# Patient Record
Sex: Female | Born: 1960 | Race: Black or African American | Hispanic: No | Marital: Single | Smoking: Never smoker
Health system: Southern US, Community
[De-identification: ages and names within clinical notes are randomized; demographics above are authoritative.]

## PROBLEM LIST (undated history)

## (undated) DIAGNOSIS — N289 Disorder of kidney and ureter, unspecified: Secondary | ICD-10-CM

## (undated) DIAGNOSIS — E669 Obesity, unspecified: Secondary | ICD-10-CM

## (undated) DIAGNOSIS — M549 Dorsalgia, unspecified: Secondary | ICD-10-CM

## (undated) DIAGNOSIS — J329 Chronic sinusitis, unspecified: Secondary | ICD-10-CM

## (undated) DIAGNOSIS — F419 Anxiety disorder, unspecified: Secondary | ICD-10-CM

## (undated) HISTORY — PX: TUBAL LIGATION: SHX77

## (undated) HISTORY — PX: BREAST SURGERY: SHX581

## (undated) HISTORY — PX: ABDOMINAL SURGERY: SHX537

---

## 2016-05-14 ENCOUNTER — Encounter: Payer: Self-pay | Admitting: Gynecology

## 2016-05-14 ENCOUNTER — Ambulatory Visit
Admission: EM | Admit: 2016-05-14 | Discharge: 2016-05-14 | Disposition: A | Discharge status: Home / Self Care | Payer: Managed Care, Other (non HMO) | Attending: Family Medicine | Admitting: Family Medicine

## 2016-05-14 DIAGNOSIS — J0100 Acute maxillary sinusitis, unspecified: Secondary | ICD-10-CM

## 2016-05-14 HISTORY — DX: Dorsalgia, unspecified: M54.9

## 2016-05-14 HISTORY — DX: Anxiety disorder, unspecified: F41.9

## 2016-05-14 HISTORY — DX: Obesity, unspecified: E66.9

## 2016-05-14 HISTORY — DX: Chronic sinusitis, unspecified: J32.9

## 2016-05-14 MED ORDER — BENZONATATE 100 MG PO CAPS: 100.0000 mg | ORAL_CAPSULE | Freq: Three times a day (TID) | ORAL | 0 refills | Status: DC | PRN

## 2016-05-14 MED ORDER — AMOXICILLIN-POT CLAVULANATE 875-125 MG PO TABS: 1.0000 | ORAL_TABLET | Freq: Two times a day (BID) | ORAL | 0 refills | Status: DC

## 2016-05-14 MED ORDER — AMOXICILLIN-POT CLAVULANATE 875-125 MG PO TABS: 1.0000 | ORAL_TABLET | Freq: Two times a day (BID) | ORAL | 0 refills | Status: AC

## 2016-05-14 MED ORDER — FLUTICASONE PROPIONATE 50 MCG/ACT NA SUSP: 2.0000 | Freq: Every day | NASAL | 0 refills | Status: DC

## 2016-05-14 MED ORDER — FLUTICASONE PROPIONATE 50 MCG/ACT NA SUSP: 2.0000 | Freq: Every day | NASAL | 0 refills | Status: AC

## 2016-05-14 NOTE — ED Triage Notes (Signed)
Patient c/o respiratory infection x 6 days.

## 2016-05-14 NOTE — Discharge Instructions (Signed)
Start Augmentin twice a day as directed. Use Tessalon cough pills 1 every 8 hours as needed for cough. May continue Sudafed 60mg  every 8 hours and Flonase 2 sprays each nostril once daily. Rest. Increase fluid intake to help loosen up mucus. Follow-up with a primary care provider in 3 to 4 days if not improving.

## 2016-05-14 NOTE — ED Provider Notes (Signed)
CSN: [id number]     Arrival date & time 05/14/16  1505 History   First MD Initiated Contact with Patient 05/14/16 1702     Chief Complaint  Patient presents with  . URI   (Consider location/radiation/quality/duration/timing/severity/associated sxs/prior Treatment) 55 year old female presents with nasal congestion, sinus pressure, sore throat, headache, cough for the past 6 to 7 days. Denies any fever or GI symptoms. Has taken Sudafed, Flonase, Robitussin DM, cough drops with minimal relief. Usually has a sinus infection every year but otherwise no chronic health issues. Takes no daily medication.   The history is provided by the patient.  URI  Presenting symptoms: congestion, cough, fatigue, rhinorrhea and sore throat   Presenting symptoms: no ear pain and no fever   Associated symptoms: headaches and sinus pain   Associated symptoms: no neck pain and no wheezing     Past Medical History:  Diagnosis Date  . Anxiety   . Back pain   . Obesity   . Sinus infection    Past Surgical History:  Procedure Laterality Date  . ABDOMINAL SURGERY     abdominoplasty  . BREAST SURGERY Left    breast reduction  . CESAREAN SECTION    . TUBAL LIGATION     No family history on file. Social History  Substance Use Topics  . Smoking status: Never Smoker  . Smokeless tobacco: Never Used  . Alcohol use No   OB History    No data available     Review of Systems  Constitutional: Positive for fatigue. Negative for appetite change, chills and fever.  HENT: Positive for congestion, rhinorrhea, sinus pain, sinus pressure and sore throat. Negative for ear pain.   Eyes: Negative for discharge.  Respiratory: Positive for cough. Negative for chest tightness, shortness of breath and wheezing.   Cardiovascular: Negative for chest pain.  Gastrointestinal: Negative for abdominal pain, diarrhea, nausea and vomiting.  Musculoskeletal: Negative for neck pain and neck stiffness.  Skin: Negative for rash.   Neurological: Positive for light-headedness and headaches. Negative for dizziness, syncope and weakness.  Hematological: Negative for adenopathy.    Allergies  Sulfa antibiotics  Home Medications   Prior to Admission medications   Medication Sig Start Date End Date Taking? Authorizing Provider  amoxicillin-clavulanate (AUGMENTIN) 875-125 MG tablet Take 1 tablet by mouth every 12 (twelve) hours. 05/14/16 05/21/16  Sudie GrumblingAnn Berry Lizvette Lightsey, NP  benzonatate (TESSALON) 100 MG capsule Take 1 capsule (100 mg total) by mouth 3 (three) times daily as needed for cough. 05/14/16   Sudie GrumblingAnn Berry Mirl Hillery, NP  fluticasone (FLONASE) 50 MCG/ACT nasal spray Place 2 sprays into both nostrils daily. 05/14/16   Sudie GrumblingAnn Berry Anand Tejada, NP   Meds Ordered and Administered this Visit  Medications - No data to display  BP 121/78 (BP Location: Left Arm)   Pulse 93   Temp 99.5 F (37.5 C) (Oral)   Resp 16   Ht 5\' 5"  (1.651 m)   Wt 197 lb (89.4 kg)   SpO2 99%   BMI 32.78 kg/m  No data found.   Physical Exam  Constitutional: She is oriented to person, place, and time. She appears well-developed and well-nourished. She appears ill. No distress.  HENT:  Head: Normocephalic and atraumatic.  Right Ear: Hearing, external ear and ear canal normal. Tympanic membrane is bulging.  Left Ear: Hearing, external ear and ear canal normal. Tympanic membrane is bulging.  Nose: Mucosal edema and rhinorrhea present. Right sinus exhibits maxillary sinus tenderness and frontal sinus  tenderness. Left sinus exhibits maxillary sinus tenderness and frontal sinus tenderness.  Mouth/Throat: Uvula is midline and mucous membranes are normal. Posterior oropharyngeal erythema present.  Neck: Normal range of motion. Neck supple.  Cardiovascular: Normal rate, regular rhythm and normal heart sounds.   Pulmonary/Chest: Effort normal. She has no decreased breath sounds. She has no wheezes. She has rhonchi in the right upper field and the left upper field.   Lymphadenopathy:    She has no cervical adenopathy.  Neurological: She is alert and oriented to person, place, and time.  Skin: Skin is warm and dry.  Psychiatric: She has a normal mood and affect. Her behavior is normal. Judgment and thought content normal.    Urgent Care Course   Clinical Course     Procedures (including critical care time)  Labs Review Labs Reviewed - No data to display  Imaging Review No results found.   Visual Acuity Review  Right Eye Distance:   Left Eye Distance:   Bilateral Distance:    Right Eye Near:   Left Eye Near:    Bilateral Near:         MDM   1. Acute non-recurrent maxillary sinusitis    Recommend start Augmentin 875mg  twice a day as directed. May use Tessalon cough pills every 8 hours as needed for cough. May continue Sudafed 60mg  every 8 hours and Flonase 2 sprays in each nostril daily for congestion. Rest. Increase fluid intake to help loosen up mucus. Follow-up with her primary care provider in 3 to 4 days if not improving.      Sudie GrumblingAnn Berry Garvin Ellena, NP 05/15/16 [phone]

## 2016-05-17 ENCOUNTER — Ambulatory Visit
Admission: EM | Admit: 2016-05-17 | Discharge: 2016-05-17 | Disposition: A | Discharge status: Home / Self Care | Payer: Managed Care, Other (non HMO) | Attending: Emergency Medicine | Admitting: Emergency Medicine

## 2016-05-17 DIAGNOSIS — T7840XA Allergy, unspecified, initial encounter: Secondary | ICD-10-CM

## 2016-05-17 MED ORDER — METHYLPREDNISOLONE ACETATE 80 MG/ML IJ SUSP: 80.0000 mg | Freq: Once | INTRAMUSCULAR | Status: DC

## 2016-05-17 MED ORDER — EPINEPHRINE PF 1 MG/ML IJ SOLN
0.3000 mg | Freq: Once | INTRAMUSCULAR | Status: AC
  Administered 2016-05-17: 0.3 mg via SUBCUTANEOUS

## 2016-05-17 MED ORDER — DIPHENHYDRAMINE HCL 50 MG PO CAPS
50.0000 mg | ORAL_CAPSULE | Freq: Once | ORAL | Status: AC
  Administered 2016-05-17: 50 mg via ORAL

## 2016-05-17 MED ORDER — METHYLPREDNISOLONE SODIUM SUCC 125 MG IJ SOLR
125.0000 mg | Freq: Once | INTRAMUSCULAR | Status: AC
  Administered 2016-05-17: 125 mg via INTRAMUSCULAR

## 2016-05-17 MED ORDER — AZITHROMYCIN 250 MG PO TABS: 250.0000 mg | ORAL_TABLET | Freq: Every day | ORAL | 0 refills | Status: AC

## 2016-05-17 NOTE — Discharge Instructions (Signed)
Most likely you did get allergic to the Augmentin, which has amoxicillin in it. You will most likely be allergic to penicillin as well so be careful  I am so glad that you are feeling so much better. I will switch you to z-pack. Have a good Thanksgiving.

## 2016-05-17 NOTE — ED Triage Notes (Addendum)
Pt was here this past weekend and she is possibly having some side effects from the antibiotic, she is having diarrhea, scratchy throat and feels like pins and needles in her throat. She states these symptoms are new since taking the amoxicillin. She took her last dose at 9 pm last night.

## 2016-05-17 NOTE — ED Provider Notes (Signed)
CSN: [id number]     Arrival date & time 05/17/16  [id number] History   None    Chief Complaint  Patient presents with  . Cough   (Consider location/radiation/quality/duration/timing/severity/associated sxs/prior Treatment) Kari Shepherd is a well-appearing 55 y.o female, recently seem 2 days ago and was given Augmentin and Tessalon for her sinus infection. Patient returns today believing that she got allergic to the Augmentin. She reports started taking the Augmentin on Monday afternoon and she started experiencing tightness in her throat, "a lump in throat" feeling, difficulty swallowing, and shortness of breath on Tuesday night. She denies any rash or lip swelling.atient reports to have been taking Tessalon the past 2 days as well but reports that she have taken tessalon several times in the past without any reaction. Patient reports the last time she took Augmentin was many years ago and she cannot recall if she had allergic reactions to the Augmentin in the past.       Past Medical History:  Diagnosis Date  . Anxiety   . Back pain   . Obesity   . Sinus infection    Past Surgical History:  Procedure Laterality Date  . ABDOMINAL SURGERY     abdominoplasty  . BREAST SURGERY Left    breast reduction  . CESAREAN SECTION    . TUBAL LIGATION     History reviewed. No pertinent family history. Social History  Substance Use Topics  . Smoking status: Never Smoker  . Smokeless tobacco: Never Used  . Alcohol use No   OB History    No data available     Review of Systems  Constitutional: Positive for chills, fatigue and fever.  HENT: Positive for congestion, ear pain, rhinorrhea and sore throat. Negative for sinus pain, sinus pressure and sneezing.   Respiratory: Positive for cough and shortness of breath.   Cardiovascular: Positive for chest pain, palpitations and leg swelling.  Gastrointestinal: Positive for diarrhea. Negative for abdominal pain, nausea and vomiting.  Musculoskeletal:  Negative for myalgias.  Skin: Negative for rash.  Neurological: Negative for dizziness and headaches.    Allergies  Sulfa antibiotics  Home Medications   Prior to Admission medications   Medication Sig Start Date End Date Taking? Authorizing Provider  amoxicillin-clavulanate (AUGMENTIN) 875-125 MG tablet Take 1 tablet by mouth every 12 (twelve) hours. 05/14/16 05/21/16 Yes Ann Cathren HarshBerry Amyot, NP  benzonatate (TESSALON) 100 MG capsule Take 1 capsule (100 mg total) by mouth 3 (three) times daily as needed for cough. 05/14/16  Yes Sudie GrumblingAnn Berry Amyot, NP  fluticasone (FLONASE) 50 MCG/ACT nasal spray Place 2 sprays into both nostrils daily. 05/14/16  Yes Sudie GrumblingAnn Berry Amyot, NP   Meds Ordered and Administered this Visit   Medications  EPINEPHrine (ADRENALIN) 0.3 mg (0.3 mg Subcutaneous Given 05/17/16 0912)  diphenhydrAMINE (BENADRYL) capsule 50 mg (50 mg Oral Given 05/17/16 0912)  methylPREDNISolone sodium succinate (SOLU-MEDROL) 125 mg/2 mL injection 125 mg (125 mg Intramuscular Given 05/17/16 0911)    BP (!) 106/53 (BP Location: Left Arm)   Pulse 89   Temp 98.7 F (37.1 C) (Oral)   Resp 18   Ht 5\' 5"  (1.651 m)   Wt 197 lb (89.4 kg)   SpO2 97%   BMI 32.78 kg/m  No data found.   Physical Exam  Constitutional: She is oriented to person, place, and time. She appears well-developed and well-nourished. No distress.  HENT:  Head: Normocephalic and atraumatic.  Right Ear: External ear normal.  Left Ear: External ear normal.  Nose: Nose normal.  Mouth/Throat: Oropharynx is clear and moist. No oropharyngeal exudate.  Eyes: EOM are normal. Pupils are equal, round, and reactive to light.  Neck: Normal range of motion. Neck supple.  Cardiovascular: Normal rate, regular rhythm, normal heart sounds and intact distal pulses.   No murmur heard. Pulmonary/Chest: Effort normal and breath sounds normal. No respiratory distress. She has no wheezes. She has no rales.  Neurological: She is alert and  oriented to person, place, and time.  Skin: Skin is warm and dry. No rash noted. She is not diaphoretic.  Psychiatric: She has a normal mood and affect.  Nursing note and vitals reviewed.   Urgent Care Course   Clinical Course     Procedures (including critical care time)  Labs Review Labs Reviewed - No data to display  Imaging Review No results found.     0915: Benadryl, Depo-medrol, and Epi administered  09:45: Patient re-evaluated; patient reports feeling "so much" better. She feels fine to be discharged home MDM   1. Allergic reaction to drug, initial encounter    1) No concerning finding on physical exam.  2) Patient reports feeling better and is safe to be discharged home, however ER return precaution discussed 3) Will switch to Z-pack for her sinus infection, patient reports have always taken Z-pack each year for her previous sinus infection.  4) Continue with the tessalon and Flonase at home 5) F/u with PCP as scheduled    Lucia EstelleFeng Gracemarie Skeet, NP 05/17/16 [phone]

## 2018-05-05 ENCOUNTER — Emergency Department: Payer: 59

## 2018-05-05 ENCOUNTER — Emergency Department
Admission: EM | Admit: 2018-05-05 | Discharge: 2018-05-05 | Disposition: A | Discharge status: Home / Self Care | Payer: 59 | Attending: Emergency Medicine | Admitting: Emergency Medicine

## 2018-05-05 DIAGNOSIS — N200 Calculus of kidney: Secondary | ICD-10-CM | POA: Diagnosis not present

## 2018-05-05 DIAGNOSIS — R1012 Left upper quadrant pain: Secondary | ICD-10-CM | POA: Diagnosis present

## 2018-05-05 DIAGNOSIS — Z79899 Other long term (current) drug therapy: Secondary | ICD-10-CM | POA: Insufficient documentation

## 2018-05-05 DIAGNOSIS — K5790 Diverticulosis of intestine, part unspecified, without perforation or abscess without bleeding: Secondary | ICD-10-CM

## 2018-05-05 DIAGNOSIS — R109 Unspecified abdominal pain: Secondary | ICD-10-CM

## 2018-05-05 LAB — BASIC METABOLIC PANEL
Anion gap: 8 (ref 5–15)
BUN: 13 mg/dL (ref 6–20)
CHLORIDE: 106 mmol/L (ref 98–111)
CO2: 27 mmol/L (ref 22–32)
CREATININE: 0.96 mg/dL (ref 0.44–1.00)
Calcium: 9.8 mg/dL (ref 8.9–10.3)
GFR calc non Af Amer: >60 mL/min (ref >60)
Glucose, Bld: 125 mg/dL — ABNORMAL HIGH (ref 70–99)
POTASSIUM: 3.8 mmol/L (ref 3.5–5.1)
Sodium: 141 mmol/L (ref 135–145)

## 2018-05-05 LAB — CBC WITH DIFFERENTIAL/PLATELET
ABS IMMATURE GRANULOCYTES: 0.03 10*3/uL (ref 0.00–0.07)
BASOS ABS: 0 10*3/uL (ref 0.0–0.1)
Basophils Relative: 0 %
Eosinophils Absolute: 0.1 10*3/uL (ref 0.0–0.5)
Eosinophils Relative: 1 %
HEMATOCRIT: 37.2 % (ref 36.0–46.0)
HEMOGLOBIN: 11.8 g/dL — AB (ref 12.0–15.0)
Immature Granulocytes: 0 %
LYMPHS ABS: 1.8 10*3/uL (ref 0.7–4.0)
LYMPHS PCT: 22 %
MCH: 28.6 pg (ref 26.0–34.0)
MCHC: 31.7 g/dL (ref 30.0–36.0)
MCV: 90.1 fL (ref 80.0–100.0)
MONO ABS: 0.5 10*3/uL (ref 0.1–1.0)
Monocytes Relative: 6 %
Neutro Abs: 5.6 10*3/uL (ref 1.7–7.7)
Neutrophils Relative %: 71 %
Platelets: 276 10*3/uL (ref 150–400)
RBC: 4.13 MIL/uL (ref 3.87–5.11)
RDW: 12.9 % (ref 11.5–15.5)
WBC: 7.9 10*3/uL (ref 4.0–10.5)
nRBC: 0 % (ref 0.0–0.2)

## 2018-05-05 LAB — URINALYSIS, COMPLETE (UACMP) WITH MICROSCOPIC
Bilirubin Urine: NEGATIVE
Glucose, UA: NEGATIVE mg/dL
Ketones, ur: NEGATIVE mg/dL
Leukocytes, UA: NEGATIVE
NITRITE: NEGATIVE
PH: 6 (ref 5.0–8.0)
Protein, ur: NEGATIVE mg/dL
SPECIFIC GRAVITY, URINE: 1.002 — AB (ref 1.005–1.030)

## 2018-05-05 MED ORDER — SODIUM CHLORIDE 0.9 % IV BOLUS
500.0000 mL | Freq: Once | INTRAVENOUS | Status: AC
  Administered 2018-05-05: 500 mL via INTRAVENOUS

## 2018-05-05 MED ORDER — KETOROLAC TROMETHAMINE 30 MG/ML IJ SOLN
15.0000 mg | Freq: Once | INTRAMUSCULAR | Status: AC
  Administered 2018-05-05: 15 mg via INTRAVENOUS
  Filled 2018-05-05: qty 1

## 2018-05-05 MED ORDER — ONDANSETRON HCL 4 MG/2ML IJ SOLN
4.0000 mg | Freq: Once | INTRAMUSCULAR | Status: AC
  Administered 2018-05-05: 4 mg via INTRAVENOUS
  Filled 2018-05-05: qty 2

## 2018-05-05 MED ORDER — ONDANSETRON HCL 4 MG PO TABS: 4.0000 mg | ORAL_TABLET | Freq: Three times a day (TID) | ORAL | 0 refills | Status: AC | PRN

## 2018-05-05 MED ORDER — OXYCODONE-ACETAMINOPHEN 5-325 MG PO TABS: 1.0000 | ORAL_TABLET | ORAL | 0 refills | Status: DC | PRN

## 2018-05-05 MED ORDER — IBUPROFEN 200 MG PO TABS: 600.0000 mg | ORAL_TABLET | Freq: Four times a day (QID) | ORAL | 0 refills | Status: AC | PRN

## 2018-05-05 NOTE — ED Triage Notes (Signed)
Pt presents via POV c/o left flank pain x2 days with hematuria.

## 2018-05-05 NOTE — Discharge Instructions (Addendum)
You have a large kidney stone in your left kidney which is likely what is causing you problems today.  If you have severe pain, vomiting, or you feel worse in any way please return to the emergency room.  We particularly like you to be careful about fever, if you have a fever we would like you to return.  We will send your prescription for pain medication.  Do not drive while taking the Percocet.  I would recommend not taking it at all unless you absolutely have to take it.  Nonsteroidal pain medication such as Motrin will help you more, and have less addictive potential.  I would recommend also following up with your primary care doctor as some of your blood pressure readings were little high here.  This is likely because you are watching the blood pressure machine and are somewhat anxious about it, but we would ask you to check that with your doctor in the next few days.  If you have chest pain shortness of breath severe headache numbness or weakness or other concerns please return to the emergency department.  We do not anticipate this likely will happen however.

## 2018-05-05 NOTE — ED Provider Notes (Addendum)
[facility] Emergency Department Provider Note  ____________________________________________   I have reviewed the triage vital signs and the nursing notes. Where available I have reviewed prior notes and, if possible and indicated, outside hospital notes.    HISTORY  Chief Complaint Flank Pain and Hematuria    HPI Kari Shepherd is a 57 y.o. female no history of kidney stones, states that 2 days ago she had sudden onset left flank pain that radiated to towards her groin.  It was accompanied by mild hematuria.  Nausea but no vomiting.  No fever no chills no dysuria, nothing makes it better nothing makes it worse, waxes and wanes, sharp.  Did have something similar 2 months ago but it was much more mild than this.  Pain is significant when it is at its worst at this time its moderate.  No other alleviating or aggravating factors. No other prior treatment.   Past Medical History:  Diagnosis Date  . Anxiety   . Back pain   . Obesity   . Sinus infection     There are no active problems to display for this patient.   Past Surgical History:  Procedure Laterality Date  . ABDOMINAL SURGERY     abdominoplasty  . BREAST SURGERY Left    breast reduction  . CESAREAN SECTION    . TUBAL LIGATION      Prior to Admission medications   Medication Sig Start Date End Date Taking? Authorizing Provider  benzonatate (TESSALON) 100 MG capsule Take 1 capsule (100 mg total) by mouth 3 (three) times daily as needed for cough. 05/14/16   Sudie Grumbling, NP  fluticasone (FLONASE) 50 MCG/ACT nasal spray Place 2 sprays into both nostrils daily. 05/14/16   Sudie Grumbling, NP    Allergies Penicillins and Sulfa antibiotics  History reviewed. No pertinent family history.  Social History Social History   Tobacco Use  . Smoking status: Never Smoker  . Smokeless tobacco: Never Used  Substance Use Topics  . Alcohol use: No  . Drug use: Not on file    Review of  Systems Constitutional: No fever/chills Eyes: No visual changes. ENT: No sore throat. No stiff neck no neck pain Cardiovascular: Denies chest pain. Respiratory: Denies shortness of breath. Gastrointestinal:   no vomiting.  No diarrhea.  No constipation. Genitourinary: Negative for dysuria. Musculoskeletal: Negative lower extremity swelling Skin: Negative for rash. Neurological: Negative for severe headaches, focal weakness or numbness.   ____________________________________________   PHYSICAL EXAM:  VITAL SIGNS: ED Triage Vitals  Enc Vitals Group     BP 05/05/18 1443 (!) 145/80     Pulse Rate 05/05/18 1443 79     Resp 05/05/18 1443 14     Temp 05/05/18 1443 98.6 F (37 C)     Temp Source 05/05/18 1443 Oral     SpO2 05/05/18 1443 98 %     Weight 05/05/18 1444 190 lb (86.2 kg)     Height 05/05/18 1444 5\' 4"  (1.626 m)     Head Circumference --      Peak Flow --      Pain Score 05/05/18 1444 8     Pain Loc --      Pain Edu? --      Excl. in GC? --     Constitutional: Alert and oriented. Well appearing and in no acute distress. Eyes: Conjunctivae are normal Head: Atraumatic HEENT: No congestion/rhinnorhea. Mucous membranes are moist.  Oropharynx non-erythematous Neck:   Nontender with  no meningismus, no masses, no stridor Cardiovascular: Normal rate, regular rhythm. Grossly normal heart sounds.  Good peripheral circulation. Respiratory: Normal respiratory effort.  No retractions. Lungs CTAB. Abdominal: Soft and nontender. No distention. No guarding no rebound Back:  There is no focal tenderness or step off.  there is no midline tenderness there are no lesions noted. there is positive left CVA tenderness Musculoskeletal: No lower extremity tenderness, no upper extremity tenderness. No joint effusions, no DVT signs strong distal pulses no edema Neurologic:  Normal speech and language. No gross focal neurologic deficits are appreciated.  Skin:  Skin is warm, dry and intact.  No rash noted. Psychiatric: Mood and affect are normal. Speech and behavior are normal.  ____________________________________________   LABS (all labs ordered are listed, but only abnormal results are displayed)  Labs Reviewed  URINALYSIS, COMPLETE (UACMP) WITH MICROSCOPIC - Abnormal; Notable for the following components:      Result Value   Color, Urine COLORLESS (*)    APPearance CLEAR (*)    Specific Gravity, Urine 1.002 (*)    Hgb urine dipstick LARGE (*)    Bacteria, UA RARE (*)    All other components within normal limits  CBC WITH DIFFERENTIAL/PLATELET - Abnormal; Notable for the following components:   Hemoglobin 11.8 (*)    All other components within normal limits  BASIC METABOLIC PANEL - Abnormal; Notable for the following components:   Glucose, Bld 125 (*)    All other components within normal limits    Pertinent labs  results that were available during my care of the patient were reviewed by me and considered in my medical decision making (see chart for details). ____________________________________________  EKG  I personally interpreted any EKGs ordered by me or triage  ____________________________________________  RADIOLOGY  Pertinent labs & imaging results that were available during my care of the patient were reviewed by me and considered in my medical decision making (see chart for details). If possible, patient and/or family made aware of any abnormal findings.  No results found. ____________________________________________    PROCEDURES  Procedure(s) performed: None  Procedures  Critical Care performed: None  ____________________________________________   INITIAL IMPRESSION / ASSESSMENT AND PLAN / ED COURSE  Pertinent labs & imaging results that were available during my care of the patient were reviewed by me and considered in my medical decision making (see chart for details).  Seen here with flank pain, radiating sometimes towards her  groin, hematuria, most likely his kidney stone other things such as ovarian cyst or AAA and UTI are considered, we will obtain CT scan to help verify and delineate diagnosis.  In the meantime we are giving her IV fluid pain medication and nausea medication   ----------------------------------------- 5:49 PM on 05/05/2018 -----------------------------------------  States her pain is controlled, she has been watching the blood pressure cuff very intently in the room and every time it goes off, she states it makes her more anxious.  She is wearing weights making her blood pressure go up.  I suspect that this is primarily because of anxiety.  Her blood pressure was 133 before she started watching it.  In any event, she has no evidence of AAA ACS PE dissection she is very reproducible flank pain she has a large stone in her kidney which could be causing some of this problem she may have also passed a small stone which I think is most likely.  Given the hematuria.  No evidence of pyelonephritis AAA, or other intra-abdominal pathologies.  We will refer her to [facility], as patient will likely need lithotripsy for the stone if it remains symptomatic.  She is aware that she has a history of diverticulosis, and will watch out for signs and symptoms of diverticulitis which she does not have.  Patient has been given all the results of all of her tests and she is comfortable with it.  Patient does know that she must return to her doctor the next couple days to have her blood pressure rechecked but this is asymptomatic and I think likely related to anxiety and that is how she describes it.  And I want to keep her in the department as I think it would just make things worse and she again is having no symptoms.  Her pain is very well controlled after Toradol, she still a little bit uncomfortable but would like to go home we will discharge her.  Extensive return precautions follow-up given and understood.  Considering the  patient's symptoms, medical history, and physical examination today, I have low suspicion for cholecystitis or biliary pathology, pancreatitis, perforation or bowel obstruction, hernia, intra-abdominal abscess, AAA or dissection, volvulus or intussusception, mesenteric ischemia, ischemic gut, pyelonephritis or appendicitis.  Nor is there any indication that the patient is having ACS PE dissection or other referred thoracic pathology   ____________________________________________   FINAL CLINICAL IMPRESSION(S) / ED DIAGNOSES  Final diagnoses:  None      This chart was dictated using voice recognition software.  Despite best efforts to proofread,  errors can occur which can change meaning.      Jeanmarie Plant, MD 05/05/18 1614    Jeanmarie Plant, MD 05/05/18 [phone]

## 2018-05-05 NOTE — ED Triage Notes (Signed)
First Nurse Note:  Left flank pain and hematuria x 2 days.

## 2019-10-21 IMAGING — CT CT RENAL STONE PROTOCOL
2 of 4 series · 16 of 46 positions shown, 18 images · non-contrast
Comparison: None.

CLINICAL DATA: LEFT lower quadrant pain worsening over the past 3
days, gross hematuria, flank pain.

EXAM:
CT ABDOMEN AND PELVIS WITHOUT CONTRAST
TECHNIQUE: Multidetector CT imaging of the abdomen and pelvis was performed
following the standard protocol without IV contrast.

[Series 2: stone full standard · axial · 0.71mm/px · z∈[-366,+28]mm · 13 of 87 slices shown, 15 images]
[im 4/87  soft-tissue]
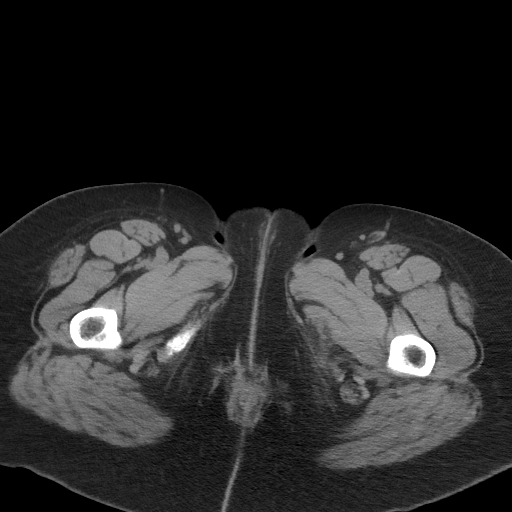
[im 4/87  bone]
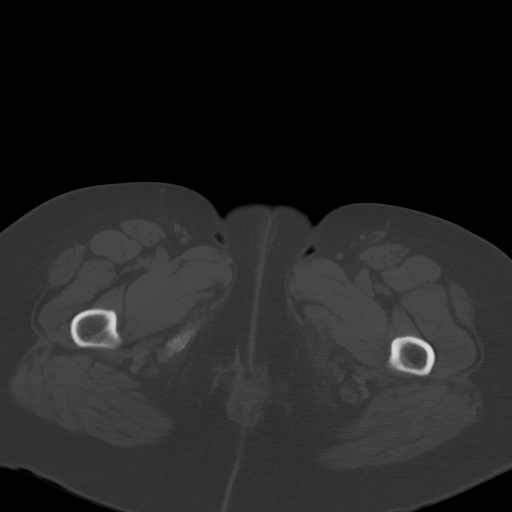
[im 11/87  soft-tissue]
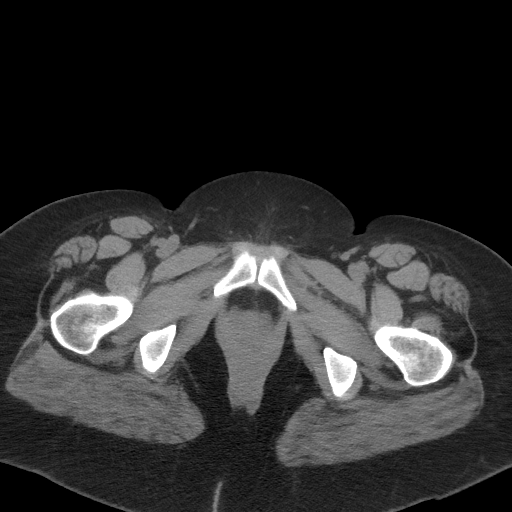
[im 18/87  soft-tissue]
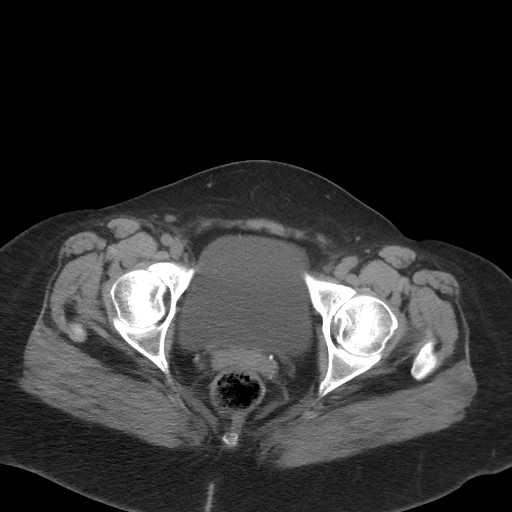
[im 26/87  soft-tissue]
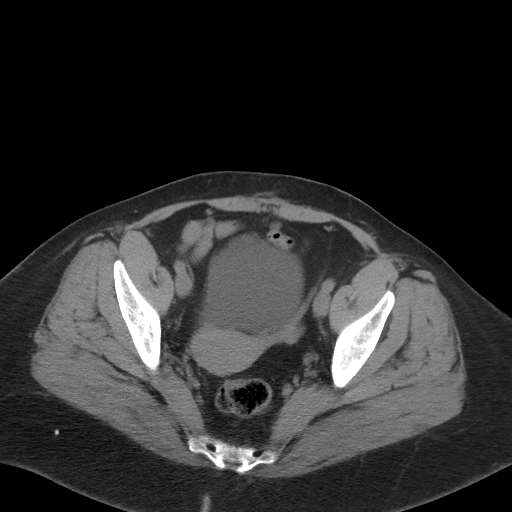
[im 29/87  soft-tissue]
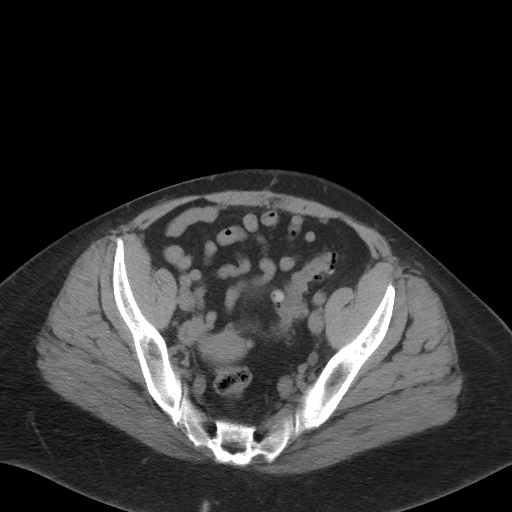
[im 36/87  soft-tissue]
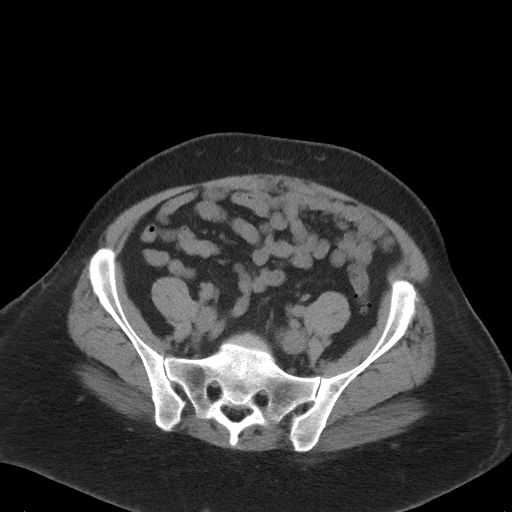
[im 44/87  soft-tissue]
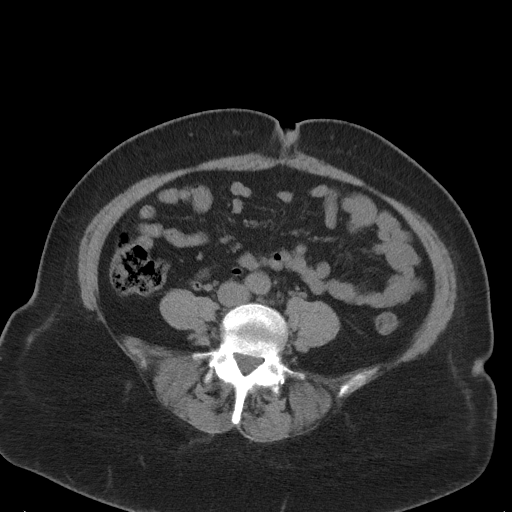
[im 51/87  soft-tissue]
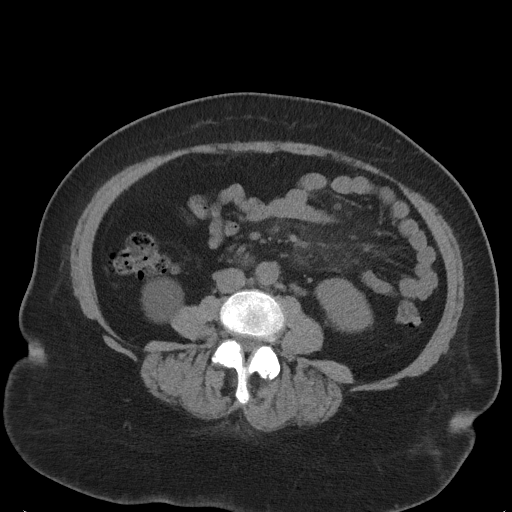
[im 58/87  soft-tissue]
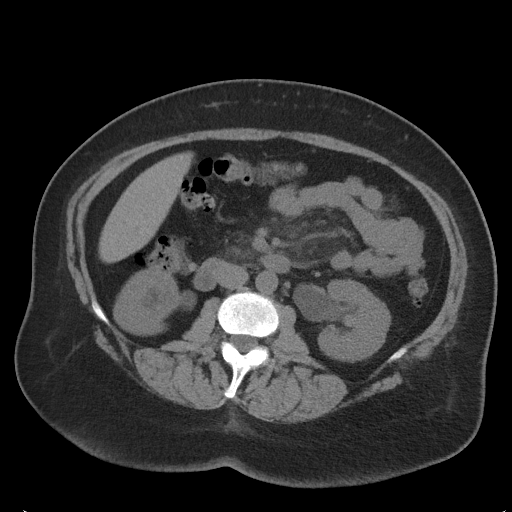
[im 58/87  bone]
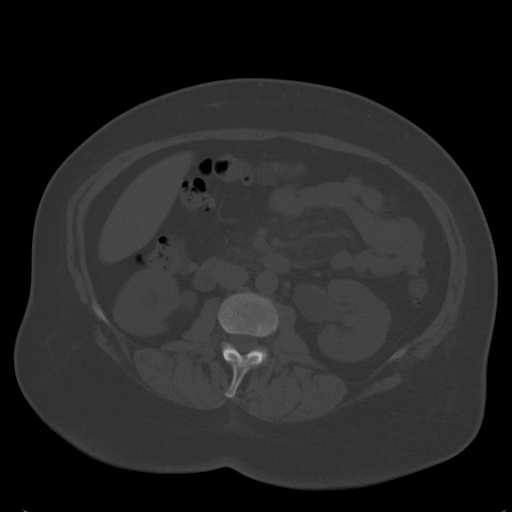
[im 61/87  soft-tissue]
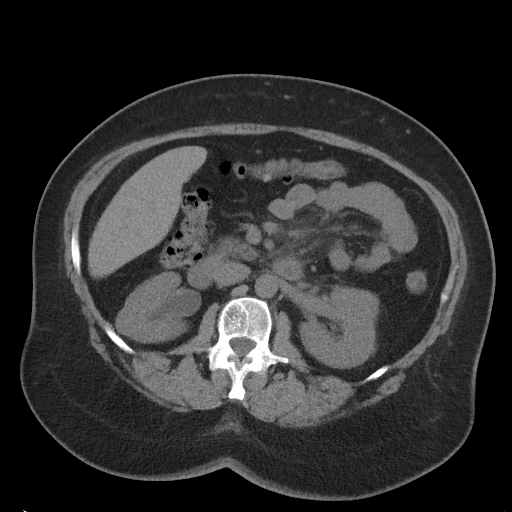
[im 69/87  soft-tissue]
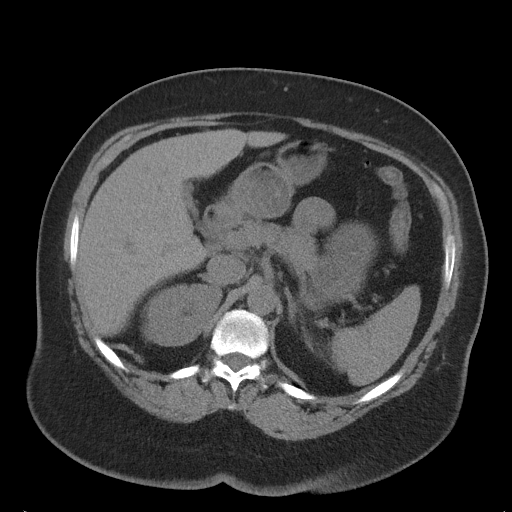
[im 76/87  soft-tissue]
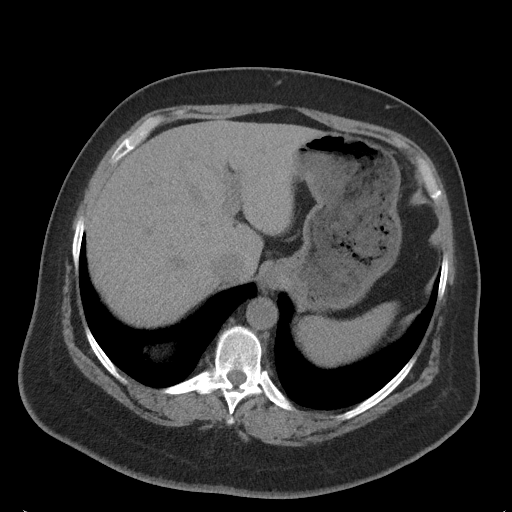
[im 83/87  soft-tissue]
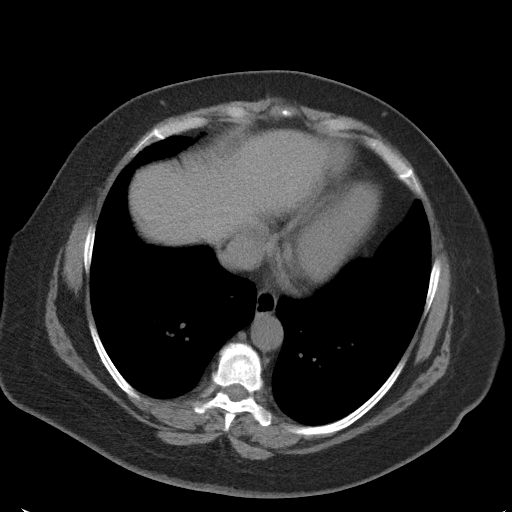

[Series 5: coronal · coronal · 0.74mm/px · 3 of 165 slices shown]
[im 55/165  soft-tissue]
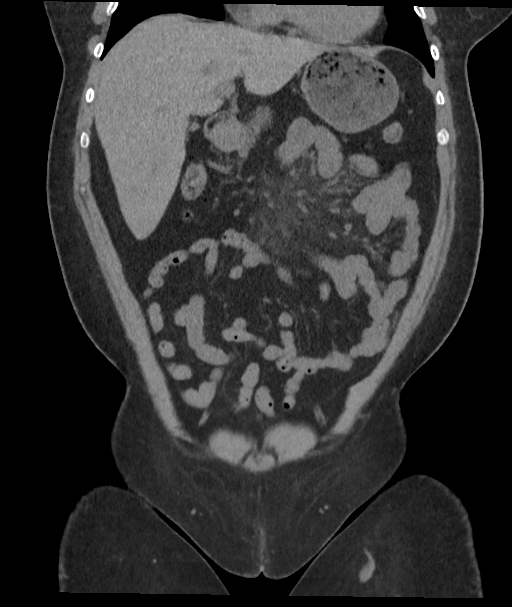
[im 73/165  soft-tissue]
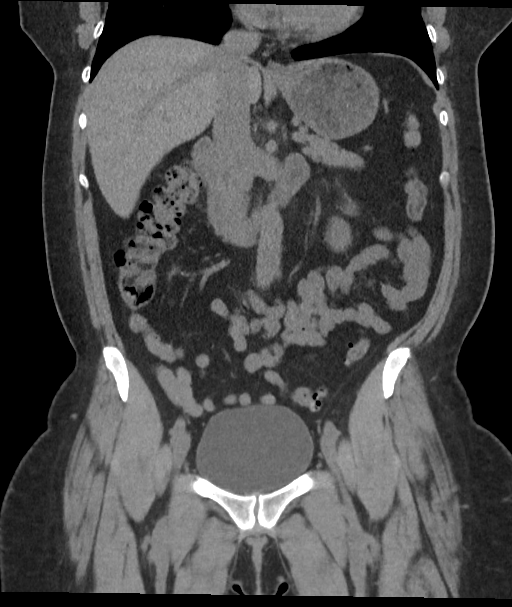
[im 92/165  soft-tissue]
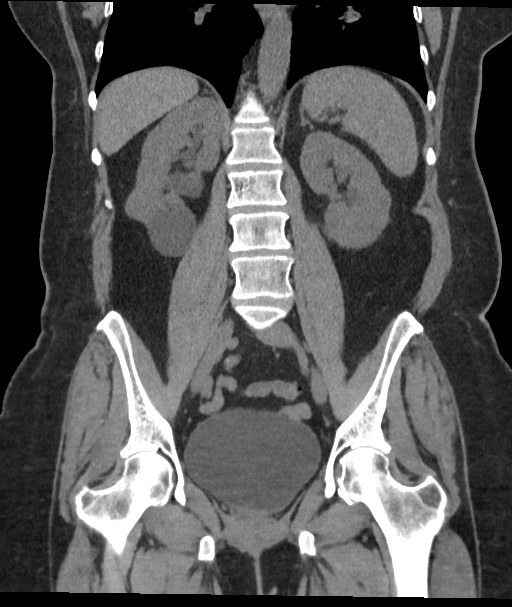

[16 of 46 positions shown; findings below may reference images not displayed]

FINDINGS: Lower chest: No acute findings. Incidental note made of a
RIGHT-sided Bochdalek's hernia which contains fat only.

Hepatobiliary: No focal liver abnormality is seen. No gallstones,
gallbladder wall thickening, or biliary dilatation.

Pancreas: Unremarkable. No pancreatic ductal dilatation or
surrounding inflammatory changes.

Spleen: Normal in size without focal abnormality.

Adrenals/Urinary Tract: Adrenal glands appear normal. RIGHT renal
cyst measures 4 cm. No RIGHT-sided renal stone or hydronephrosis. No
RIGHT-sided ureteral stone.

8 x 4 mm stone within the LEFT renal pelvis. No LEFT-sided
hydronephrosis. No LEFT-sided ureteral calculi. Mild prominence of
the lower LEFT ureter, of uncertain significance or chronicity.
Bladder appears normal. No bladder stones seen.

Stomach/Bowel: No dilated large or small bowel loops. Fairly
extensive diverticulosis of the sigmoid colon and descending colon
but no focal inflammatory change to suggest acute diverticulitis.
Additional scattered diverticulosis within the transverse and RIGHT
colon, but again without focal inflammatory change to suggest acute
diverticulitis. No evidence of bowel wall inflammation. Appendix is
normal. Stomach is unremarkable.

Vascular/Lymphatic: No significant vascular findings.

Hazy density is present within the central mesentery, with
associated small lymph nodes suggesting mesenteric adenitis or
panniculitis.

Reproductive: Uterus and bilateral adnexa are unremarkable.

Other: No free fluid or abscess collection. No free intraperitoneal
air.

Musculoskeletal: No acute or suspicious osseous finding.
IMPRESSION: 1. No obstructing ureteral stone identified. No hydronephrosis. No
bladder stone. There is mild asymmetric prominence of the LEFT
ureter which could indicate recently passed stone.
2. Nonobstructing 8 x 4 mm LEFT renal stone.
3. Hazy density within the central mesentery, with associated small
clustered lymph nodes, consistent with mesenteric adenitis or
mesenteric panniculitis, of uncertain chronicity, possibly
incidental.
4. Colonic diverticulosis without evidence of acute diverticulitis.

## 2019-11-15 ENCOUNTER — Ambulatory Visit
Admission: EM | Admit: 2019-11-15 | Discharge: 2019-11-15 | Disposition: A | Discharge status: Home / Self Care | Payer: 59 | Attending: Family Medicine | Admitting: Family Medicine

## 2019-11-15 ENCOUNTER — Other Ambulatory Visit: Payer: Self-pay

## 2019-11-15 ENCOUNTER — Encounter: Payer: Self-pay | Admitting: Emergency Medicine

## 2019-11-15 DIAGNOSIS — J302 Other seasonal allergic rhinitis: Secondary | ICD-10-CM

## 2019-11-15 HISTORY — DX: Disorder of kidney and ureter, unspecified: N28.9

## 2019-11-15 MED ORDER — FEXOFENADINE-PSEUDOEPHED ER 60-120 MG PO TB12: 1.0000 | ORAL_TABLET | Freq: Two times a day (BID) | ORAL | 0 refills | Status: AC

## 2019-11-15 MED ORDER — FLUTICASONE PROPIONATE 50 MCG/ACT NA SUSP: 2.0000 | Freq: Every day | NASAL | 0 refills | Status: AC

## 2019-11-15 NOTE — ED Provider Notes (Signed)
[facility]    CSN: [id number] Arrival date & time: 11/15/19  0801      History   Chief Complaint Chief Complaint  Patient presents with  . Sinus Problem  . Headache    HPI Kari Shepherd is a 59 y.o. female.   HPI  59 year old female presents with headache and sinus pressure that started proximally 3 days ago.  She denies any fevers.  Does have a history of seasonal allergies.  Relates that yesterday she went for a walk with a friend usually wears a mask but this time walked without the mask.  At the end of the walk she was noticed to have swelling of her head and around her eyes.  Also complains of frontal headaches that are very sharp painful but very short duration.  She has had pain down her left arm all after she had Covid shot in March.  Also explains 1 time of blurred vision while driving been able to discern the stop sign but not to be able to read the letters.  That has not recurred.  Today her vision is back to normal.       Past Medical History:  Diagnosis Date  . Anxiety   . Back pain   . Obesity   . Renal disorder   . Sinus infection     There are no problems to display for this patient.   Past Surgical History:  Procedure Laterality Date  . ABDOMINAL SURGERY     abdominoplasty  . BREAST SURGERY Left    breast reduction  . CESAREAN SECTION    . TUBAL LIGATION      OB History   No obstetric history on file.      Home Medications    Prior to Admission medications   Medication Sig Start Date End Date Taking? Authorizing Provider  fluticasone (FLONASE) 50 MCG/ACT nasal spray Place 2 sprays into both nostrils daily. 05/14/16  Yes Amyot, Nicholes Stairs, NP  fexofenadine-pseudoephedrine (ALLEGRA-D) 60-120 MG 12 hr tablet Take 1 tablet by mouth every 12 (twelve) hours. 11/15/19   Lorin Picket, PA-C  fluticasone (FLONASE) 50 MCG/ACT nasal spray Place 2 sprays into both nostrils daily. 11/15/19   Lorin Picket, PA-C  ibuprofen (MOTRIN  IB) 200 MG tablet Take 3 tablets (600 mg total) by mouth every 6 (six) hours as needed. 05/05/18   Schuyler Amor, MD  ondansetron (ZOFRAN) 4 MG tablet Take 1 tablet (4 mg total) by mouth every 8 (eight) hours as needed for nausea or vomiting. 05/05/18   Schuyler Amor, MD    Family History Family History  Problem Relation Age of Onset  . Cancer Mother   . Hypertension Father   . Diabetes Father     Social History Social History   Tobacco Use  . Smoking status: Never Smoker  . Smokeless tobacco: Never Used  Substance Use Topics  . Alcohol use: No  . Drug use: Not on file     Allergies   Penicillins and Sulfa antibiotics   Review of Systems Review of Systems  Constitutional: Positive for activity change. Negative for appetite change, chills, fatigue and fever.  HENT: Positive for sinus pressure and sinus pain.   Neurological: Positive for headaches.  All other systems reviewed and are negative.    Physical Exam Triage Vital Signs ED Triage Vitals  Enc Vitals Group     BP 11/15/19 0814 128/85     Pulse Rate 11/15/19 0814 79  Resp 11/15/19 0814 16     Temp 11/15/19 0814 98.6 F (37 C)     Temp Source 11/15/19 0814 Oral     SpO2 11/15/19 0814 98 %     Weight 11/15/19 0811 204 lb (92.5 kg)     Height 11/15/19 0811 5\' 5"  (1.651 m)     Head Circumference --      Peak Flow --      Pain Score 11/15/19 0811 0     Pain Loc --      Pain Edu? --      Excl. in GC? --    No data found.  Updated Vital Signs BP 128/85 (BP Location: Right Arm)   Pulse 79   Temp 98.6 F (37 C) (Oral)   Resp 16   Ht 5\' 5"  (1.651 m)   Wt 204 lb (92.5 kg)   SpO2 98%   BMI 33.95 kg/m   Visual Acuity Right Eye Distance:   Left Eye Distance:   Bilateral Distance:    Right Eye Near:   Left Eye Near:    Bilateral Near:     Physical Exam Vitals and nursing note reviewed.  Constitutional:      General: She is not in acute distress.    Appearance: She is well-developed.  She is not ill-appearing or toxic-appearing.  HENT:     Head: Normocephalic and atraumatic.     Comments: She does have mild tenderness to percussion over the maxillary sinuses more on the right than the left.    Mouth/Throat:     Mouth: Mucous membranes are moist.     Pharynx: Oropharynx is clear.  Eyes:     Extraocular Movements: Extraocular movements intact.     Pupils: Pupils are equal, round, and reactive to light.  Pulmonary:     Effort: Pulmonary effort is normal.     Breath sounds: Normal breath sounds.  Musculoskeletal:        General: Normal range of motion.     Cervical back: Normal range of motion and neck supple.  Lymphadenopathy:     Cervical: No cervical adenopathy.  Skin:    General: Skin is warm and dry.  Neurological:     Mental Status: She is alert and oriented to person, place, and time.     Cranial Nerves: No cranial nerve deficit, dysarthria or facial asymmetry.     Sensory: No sensory deficit.     Motor: No weakness.     Coordination: Coordination normal.     Gait: Gait normal.  Psychiatric:        Mood and Affect: Mood normal.        Speech: Speech normal.        Behavior: Behavior normal.      UC Treatments / Results  Labs (all labs ordered are listed, but only abnormal results are displayed) Labs Reviewed - No data to display  EKG   Radiology No results found.  Procedures Procedures (including critical care time)  Medications Ordered in UC Medications - No data to display  Initial Impression / Assessment and Plan / UC Course  I have reviewed the triage vital signs and the nursing notes.  Pertinent labs & imaging results that were available during my care of the patient were reviewed by me and considered in my medical decision making (see chart for details).   59 year old female presents with 2 day history of neck and sinus pressure.  She has had no fever or chills.  She also complained of headaches that were frontal but were not  prolonged and described as a sharp stabbing pain that was very short duration.  No consistency to its occurrence.  Neurological exam today was reassuring..  I have told her that it is unlikely that she has a sinus infection. Headache and visual changes are not consistent and could possibly be from the Covid shot in March but will need to be evaluated by her primary care physician if they continue.  In the meantime I feel she has seasonal allergies contributing to her symptoms.  I will place her on fluticasone daily until the pollen season have abated.  I also prescribed Allegra-D for 1 to 2 weeks.  Not improving she should follow-up with her primary care physician or if she has the headaches that we will not subside should go to the emergency room  Final Clinical Impressions(s) / UC Diagnoses   Final diagnoses:  Seasonal allergies     Discharge Instructions     Use the Flonase daily until the pollen season has subsided.  Discontinue your usual Allegra and change now to Allegra-D for about a week to help with drainage.  Follow-up with your primary care physician regarding your vision changes that you related to me today as well as the shooting headache pains that you have on occasion.    ED Prescriptions    Medication Sig Dispense Auth. Provider   fexofenadine-pseudoephedrine (ALLEGRA-D) 60-120 MG 12 hr tablet Take 1 tablet by mouth every 12 (twelve) hours. 30 tablet Ovid Curd P, PA-C   fluticasone (FLONASE) 50 MCG/ACT nasal spray Place 2 sprays into both nostrils daily. 16 g Lutricia Feil, PA-C     PDMP not reviewed this encounter.   Lutricia Feil, PA-C 11/15/19 1732

## 2019-11-15 NOTE — Discharge Instructions (Addendum)
Use the Flonase daily until the pollen season has subsided.  Discontinue your usual Allegra and change now to Allegra-D for about a week to help with drainage.  Follow-up with your primary care physician regarding your vision changes that you related to me today as well as the shooting headache pains that you have on occasion.

## 2019-11-15 NOTE — ED Triage Notes (Signed)
Patient c/o headache and sinus pressure that started yesterday.  Patient denies fevers.
